# Patient Record
Sex: Male | Born: 1990 | Race: White | Hispanic: No
Health system: Southern US, Community
[De-identification: ages and names within clinical notes are randomized; demographics above are authoritative.]

---
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
## 2017-06-09 DIAGNOSIS — Q231 Congenital insufficiency of aortic valve: Secondary | ICD-10-CM

## 2017-06-09 HISTORY — DX: Congenital insufficiency of aortic valve: Q23.1

## 2017-08-13 DIAGNOSIS — B3781 Candidal esophagitis: Secondary | ICD-10-CM

## 2019-09-03 ENCOUNTER — Ambulatory Visit: Payer: Self-pay | Attending: Internal Medicine

## 2019-09-03 DIAGNOSIS — Z23 Encounter for immunization: Secondary | ICD-10-CM

## 2019-09-03 NOTE — Progress Notes (Signed)
   Covid-19 Vaccination Clinic  Name:  Roger Patton    MRN: [id number] DOB: 1990/06/19  09/03/2019  [name] was observed post Covid-19 immunization for 15 minutes without incident. He was provided with Vaccine Information Sheet and instruction to access the V-Safe system.   [name] was instructed to call 911 with any severe reactions post vaccine: Marland Kitchen Difficulty breathing  . Swelling of face and throat  . A fast heartbeat  . A bad rash all over body  . Dizziness and weakness   Immunizations Administered    Name Date Dose VIS Date Route   Pfizer COVID-19 Vaccine 09/03/2019 11:51 AM 0.3 mL 05/20/2019 Intramuscular   Manufacturer: [employer], [employer]   Lot: [id number]   NDC: [address]-5

## 2019-09-28 ENCOUNTER — Ambulatory Visit: Payer: Self-pay | Attending: Internal Medicine

## 2019-09-28 DIAGNOSIS — Z23 Encounter for immunization: Secondary | ICD-10-CM

## 2019-09-28 NOTE — Progress Notes (Signed)
   Covid-19 Vaccination Clinic  Name:  Roger Patton    MRN: [id number] DOB: 05-17-1991  09/28/2019  [name] was observed post Covid-19 immunization for 15 minutes without incident. He was provided with Vaccine Information Sheet and instruction to access the V-Safe system.   [name] was instructed to call 911 with any severe reactions post vaccine: Marland Kitchen Difficulty breathing  . Swelling of face and throat  . A fast heartbeat  . A bad rash all over body  . Dizziness and weakness   Immunizations Administered    Name Date Dose VIS Date Route   Pfizer COVID-19 Vaccine 09/28/2019  4:47 PM 0.3 mL 08/03/2018 Intramuscular   Manufacturer: [employer], [employer]   Lot: [id number]   NDC: [address]-4

## 2019-10-11 ENCOUNTER — Ambulatory Visit: Payer: BC Managed Care – PPO | Admitting: Internal Medicine

## 2019-10-11 ENCOUNTER — Encounter: Payer: Self-pay | Admitting: Internal Medicine

## 2019-10-11 ENCOUNTER — Other Ambulatory Visit: Payer: Self-pay

## 2019-10-11 VITALS — BP 160/82 | HR 76 | Temp 98.5°F | Ht 74.0 in | Wt 286.0 lb

## 2019-10-11 DIAGNOSIS — E669 Obesity, unspecified: Secondary | ICD-10-CM

## 2019-10-11 DIAGNOSIS — E538 Deficiency of other specified B group vitamins: Secondary | ICD-10-CM | POA: Diagnosis not present

## 2019-10-11 DIAGNOSIS — I10 Essential (primary) hypertension: Secondary | ICD-10-CM | POA: Diagnosis not present

## 2019-10-11 DIAGNOSIS — Q2381 Bicuspid aortic valve: Secondary | ICD-10-CM | POA: Insufficient documentation

## 2019-10-11 DIAGNOSIS — E559 Vitamin D deficiency, unspecified: Secondary | ICD-10-CM

## 2019-10-11 DIAGNOSIS — Q231 Congenital insufficiency of aortic valve: Secondary | ICD-10-CM | POA: Insufficient documentation

## 2019-10-11 DIAGNOSIS — E7800 Pure hypercholesterolemia, unspecified: Secondary | ICD-10-CM | POA: Diagnosis not present

## 2019-10-11 DIAGNOSIS — Z0000 Encounter for general adult medical examination without abnormal findings: Secondary | ICD-10-CM | POA: Diagnosis not present

## 2019-10-11 DIAGNOSIS — L559 Sunburn, unspecified: Secondary | ICD-10-CM | POA: Diagnosis not present

## 2019-10-11 LAB — URINALYSIS, ROUTINE W REFLEX MICROSCOPIC
Bilirubin Urine: NEGATIVE
Hgb urine dipstick: NEGATIVE
Ketones, ur: NEGATIVE
Leukocytes,Ua: NEGATIVE
Nitrite: NEGATIVE
RBC / HPF: NONE SEEN (ref 0–?)
Specific Gravity, Urine: >=1.03 — AB (ref 1.000–1.030)
Total Protein, Urine: NEGATIVE
Urine Glucose: NEGATIVE
Urobilinogen, UA: 0.2 (ref 0.0–1.0)
pH: 6 (ref 5.0–8.0)

## 2019-10-11 LAB — VITAMIN B12: Vitamin B-12: 359 pg/mL (ref 211–911)

## 2019-10-11 LAB — BASIC METABOLIC PANEL
BUN: 11 mg/dL (ref 6–23)
CO2: 32 mEq/L (ref 19–32)
Calcium: 9.2 mg/dL (ref 8.4–10.5)
Chloride: 104 mEq/L (ref 96–112)
Creatinine, Ser: 1.1 mg/dL (ref 0.40–1.50)
GFR: 79.15 mL/min (ref >60.00)
Glucose, Bld: 84 mg/dL (ref 70–99)
Potassium: 3.9 mEq/L (ref 3.5–5.1)
Sodium: 139 mEq/L (ref 135–145)

## 2019-10-11 LAB — CBC WITH DIFFERENTIAL/PLATELET
Basophils Absolute: 0.1 10*3/uL (ref 0.0–0.1)
Basophils Relative: 0.9 % (ref 0.0–3.0)
Eosinophils Absolute: 0.6 10*3/uL (ref 0.0–0.7)
Eosinophils Relative: 6.3 % — ABNORMAL HIGH (ref 0.0–5.0)
HCT: 42.3 % (ref 39.0–52.0)
Hemoglobin: 14.6 g/dL (ref 13.0–17.0)
Lymphocytes Relative: 34.3 % (ref 12.0–46.0)
Lymphs Abs: 3.1 10*3/uL (ref 0.7–4.0)
MCHC: 34.5 g/dL (ref 30.0–36.0)
MCV: 81.4 fl (ref 78.0–100.0)
Monocytes Absolute: 1.1 10*3/uL — ABNORMAL HIGH (ref 0.1–1.0)
Monocytes Relative: 12.1 % — ABNORMAL HIGH (ref 3.0–12.0)
Neutro Abs: 4.2 10*3/uL (ref 1.4–7.7)
Neutrophils Relative %: 46.4 % (ref 43.0–77.0)
Platelets: 235 10*3/uL (ref 150.0–400.0)
RBC: 5.2 Mil/uL (ref 4.22–5.81)
RDW: 13.8 % (ref 11.5–15.5)
WBC: 9.1 10*3/uL (ref 4.0–10.5)

## 2019-10-11 LAB — HEPATIC FUNCTION PANEL
ALT: 25 U/L (ref 0–53)
AST: 17 U/L (ref 0–37)
Albumin: 4.3 g/dL (ref 3.5–5.2)
Alkaline Phosphatase: 55 U/L (ref 39–117)
Bilirubin, Direct: 0.1 mg/dL (ref 0.0–0.3)
Total Bilirubin: 0.3 mg/dL (ref 0.2–1.2)
Total Protein: 6.8 g/dL (ref 6.0–8.3)

## 2019-10-11 LAB — LIPID PANEL
Cholesterol: 191 mg/dL (ref 0–200)
HDL: 33.8 mg/dL — ABNORMAL LOW (ref >39.00)
NonHDL: 157.62
Total CHOL/HDL Ratio: 6
Triglycerides: 292 mg/dL — ABNORMAL HIGH (ref 0.0–149.0)
VLDL: 58.4 mg/dL — ABNORMAL HIGH (ref 0.0–40.0)

## 2019-10-11 LAB — TSH: TSH: 1.93 u[IU]/mL (ref 0.35–4.50)

## 2019-10-11 LAB — LDL CHOLESTEROL, DIRECT: Direct LDL: 122 mg/dL

## 2019-10-11 MED ORDER — PREDNISONE 10 MG PO TABS: ORAL_TABLET | ORAL | 0 refills | Status: AC

## 2019-10-11 MED ORDER — AMLODIPINE BESYLATE 5 MG PO TABS: 5.0000 mg | ORAL_TABLET | Freq: Every day | ORAL | 3 refills | Status: AC

## 2019-10-11 NOTE — Patient Instructions (Signed)
Please take all new medication as prescribed - the amlodipine 5 mg per day, and the prednisone  Please continue all other medications as before, and refills have been done if requested.  Please have the pharmacy call with any other refills you may need.  Please continue your efforts at being more active, low cholesterol diet, and weight control.  You are otherwise up to date with prevention measures today.  Please keep your appointments with your specialists as you may have planned  Please go to the LAB at the blood drawing area for the tests to be done  You will be contacted by phone if any changes need to be made immediately.  Otherwise, you will receive a letter about your results with an explanation, but please check with MyChart first.  Please remember to sign up for MyChart if you have not done so, as this will be important to you in the future with finding out test results, communicating by private email, and scheduling acute appointments online when needed.  Please make an Appointment to return in 6 months, or sooner if needed

## 2019-10-11 NOTE — Progress Notes (Signed)
   Subjective:    Patient ID: Roger Patton, male    DOB: August 04, 1990, 29 y.o.   MRN: [id number]  HPI  Here for wellness and f/u;  Overall doing ok;  Pt denies Chest pain, worsening SOB, DOE, wheezing, orthopnea, PND, worsening LE edema, palpitations, dizziness or syncope.  Pt denies neurological change such as new headache, facial or extremity weakness.  Pt denies polydipsia, polyuria, or low sugar symptoms. Pt states overall good compliance with treatment and medications, good tolerability, and has been trying to follow appropriate diet.  Pt denies worsening depressive symptoms, suicidal ideation or panic. No fever, night sweats, wt loss, loss of appetite, or other constitutional symptoms.  Pt states good ability with ADL's, has low fall risk, home safety reviewed and adequate, no other significant changes in hearing or vision, and only occasionally active with exercise. Also spent the weekend kayaking, now with severe second degree sunburn to all extremites worse to the distal RLE with painful blistering, but no fever, chills or migrating erythema or red streaks Past Medical History:  Diagnosis Date  . Bicuspid aortic valve 2019   History reviewed. No pertinent surgical history.  reports that he has never smoked. He has never used smokeless tobacco. He reports current alcohol use. He reports that he does not use drugs. family history includes Diabetes in his mother. Not on File No current outpatient medications on file prior to visit.   No current facility-administered medications on file prior to visit.   Review of Systems All otherwise neg per pt    Objective:   Physical Exam BP (!) 160/82 (BP Location: Left Arm, Patient Position: Sitting, Cuff Size: Large)   Pulse 76   Temp 98.5 F (36.9 C) (Oral)   Ht 6\' 2"  (1.88 m)   Wt 286 lb (129.7 kg)   SpO2 97%   BMI 36.72 kg/m  VS noted,  Constitutional: Pt appears in NAD HENT: Head: NCAT.  Right Ear: External ear normal.    Left Ear: External ear normal.  Eyes: . Pupils are equal, round, and reactive to light. Conjunctivae and EOM are normal Nose: without d/c or deformity Neck: Neck supple. Gross normal ROM Cardiovascular: Normal rate and regular rhythm.   Pulmonary/Chest: Effort normal and breath sounds without rales or wheezing.  Abd:  Soft, NT, ND, + BS, no organomegaly Neurological: Pt is alert. At baseline orientation, motor grossly intact Skin: Skin is warm.with all extremities sunburn type erythema with blistering distal RLE above the ankle, no LE edema Psychiatric: Pt behavior is normal without agitation  All otherwise neg per pt Lab Results  Component Value Date   WBC 9.1 10/11/2019   HGB 14.6 10/11/2019   HCT 42.3 10/11/2019   PLT 235.0 10/11/2019   GLUCOSE 84 10/11/2019   CHOL 191 10/11/2019   TRIG 292.0 (H) 10/11/2019   HDL 33.80 (L) 10/11/2019   LDLDIRECT 122.0 10/11/2019   ALT 25 10/11/2019   AST 17 10/11/2019   NA 139 10/11/2019   K 3.9 10/11/2019   CL 104 10/11/2019   CREATININE 1.10 10/11/2019   BUN 11 10/11/2019   CO2 32 10/11/2019   TSH 1.93 10/11/2019       Assessment & Plan:

## 2019-10-12 ENCOUNTER — Encounter: Payer: Self-pay | Admitting: Internal Medicine

## 2019-10-12 ENCOUNTER — Other Ambulatory Visit: Payer: Self-pay | Admitting: Internal Medicine

## 2019-10-12 LAB — VITAMIN D 25 HYDROXY (VIT D DEFICIENCY, FRACTURES): VITD: 12.46 ng/mL — ABNORMAL LOW (ref 30.00–100.00)

## 2019-10-12 MED ORDER — VITAMIN D (ERGOCALCIFEROL) 1.25 MG (50000 UNIT) PO CAPS: 50000.0000 [IU] | ORAL_CAPSULE | ORAL | 0 refills | Status: AC

## 2019-10-12 NOTE — Assessment & Plan Note (Signed)
New onset, for amlodipine 5 qd, f/u BP at home and next visit

## 2019-10-12 NOTE — Assessment & Plan Note (Signed)

## 2019-10-12 NOTE — Assessment & Plan Note (Signed)
Has gained significant wt in last 2 yrs per the [employer] until d/c a few months ago, I see no reason for activity restrictions, o/w asympt, declines echo or cardiology f/u for now

## 2019-10-12 NOTE — Assessment & Plan Note (Signed)
Encouraged activity, reduced calories, declines nutrition referral for now

## 2019-10-12 NOTE — Assessment & Plan Note (Addendum)
Second degree, for predpac asd, topical moisturizing lotions  I spent 31 minutes in addition to time for CPX wellness examination in preparing to see the patient by review of recent labs, imaging and procedures, obtaining and reviewing separately obtained history, communicating with the patient and family or caregiver, ordering medications, tests or procedures, and documenting clinical information in the EHR including the differential Dx, treatment, and any further evaluation and other management of sunburn, bicuspid aortic valve, HTN, obesity

## 2020-01-05 DIAGNOSIS — Z7251 High risk heterosexual behavior: Secondary | ICD-10-CM | POA: Diagnosis not present

## 2020-01-05 DIAGNOSIS — Z1159 Encounter for screening for other viral diseases: Secondary | ICD-10-CM | POA: Diagnosis not present

## 2020-01-05 DIAGNOSIS — Z114 Encounter for screening for human immunodeficiency virus [HIV]: Secondary | ICD-10-CM | POA: Diagnosis not present

## 2020-01-05 DIAGNOSIS — Z20822 Contact with and (suspected) exposure to covid-19: Secondary | ICD-10-CM | POA: Diagnosis not present
